# Patient Record
Sex: Male | Born: 1960 | Race: White | Hispanic: No | Marital: Married | Smoking: Never smoker
Health system: Southern US, Community
[De-identification: ages and names within clinical notes are randomized; demographics above are authoritative.]

## PROBLEM LIST (undated history)

## (undated) DIAGNOSIS — E119 Type 2 diabetes mellitus without complications: Secondary | ICD-10-CM

---

## 2009-02-24 DIAGNOSIS — Z86018 Personal history of other benign neoplasm: Secondary | ICD-10-CM

## 2009-02-24 HISTORY — DX: Personal history of other benign neoplasm: Z86.018

## 2009-05-21 ENCOUNTER — Emergency Department: Payer: Self-pay | Admitting: Emergency Medicine

## 2009-05-28 ENCOUNTER — Emergency Department: Payer: Self-pay | Admitting: Emergency Medicine

## 2011-05-04 ENCOUNTER — Ambulatory Visit: Payer: Self-pay | Admitting: Specialist

## 2012-08-31 IMAGING — CT CT STONE STUDY
1 of 2 series · 15 of 32 positions shown, 19 images · non-contrast
Comparison: none

REASON FOR EXAM: left flank pain hx of kidney stones
COMMENTS:

[Series 2: stone 3.0 i40f 3 · axial · 0.89mm/px · z∈[-508,-40]mm · 15 of 170 slices shown, 19 images]
[im 7/170  soft-tissue]
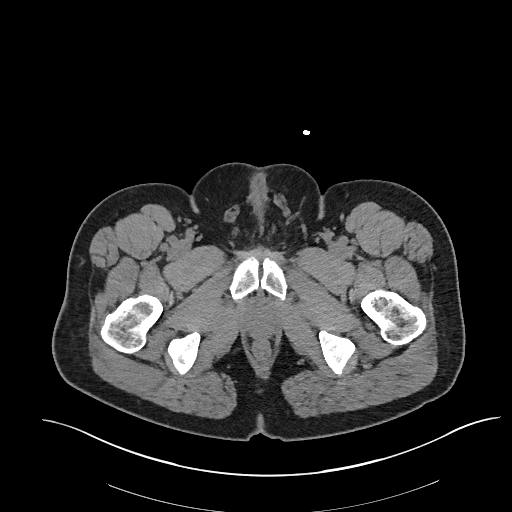
[im 7/170  bone]
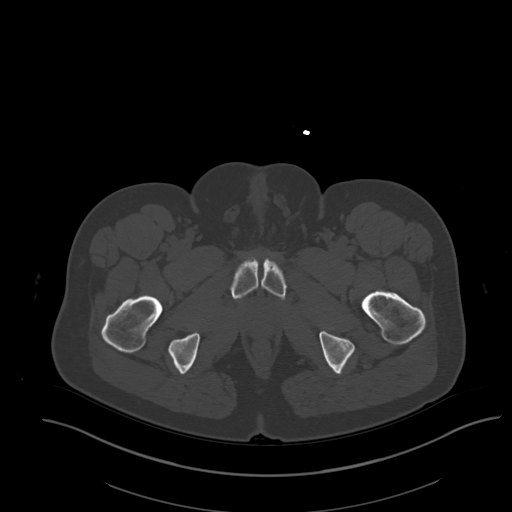
[im 20/170  soft-tissue]
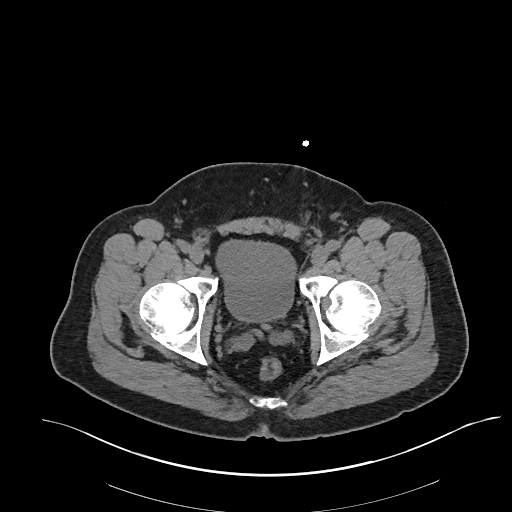
[im 33/170  soft-tissue]
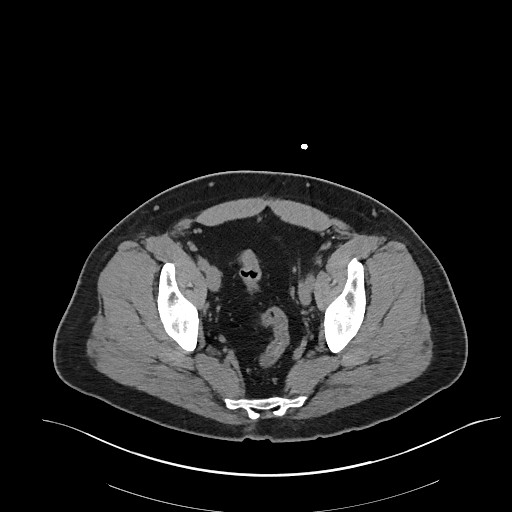
[im 46/170  soft-tissue]
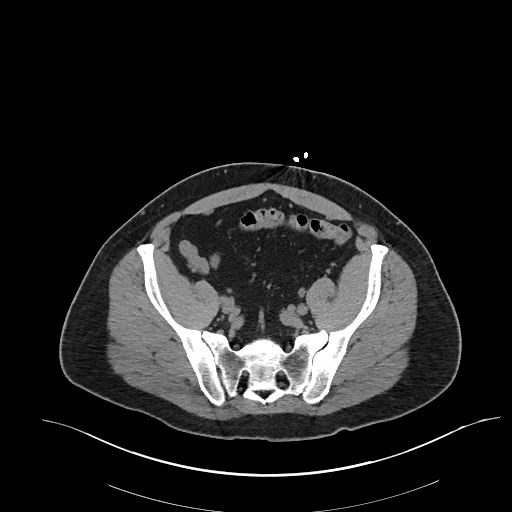
[im 59/170  soft-tissue]
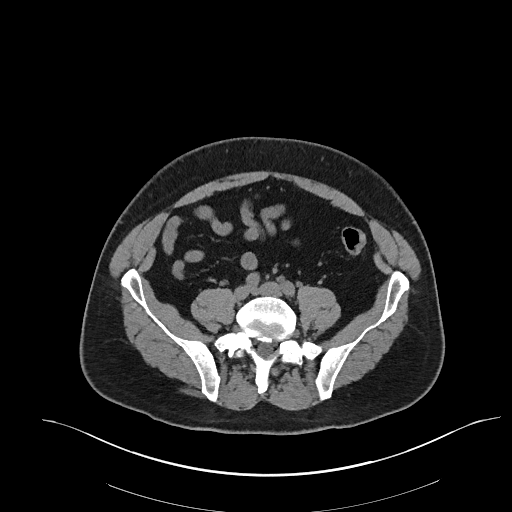
[im 72/170  soft-tissue]
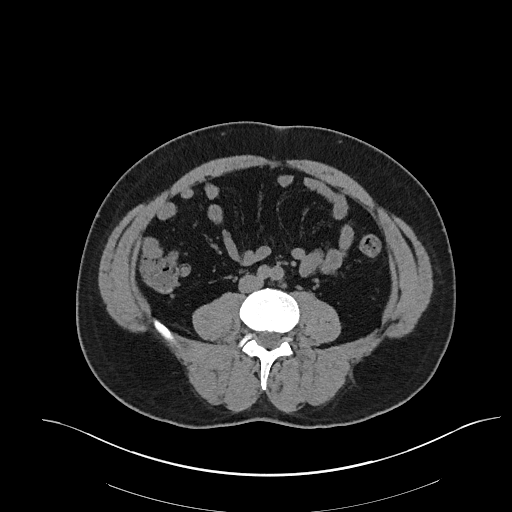
[im 85/170  soft-tissue]
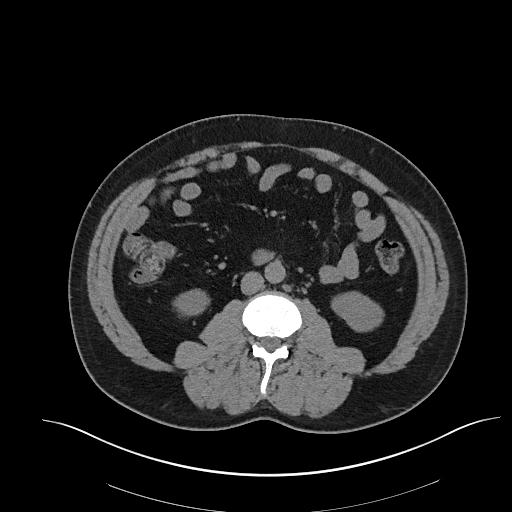
[im 98/170  soft-tissue]
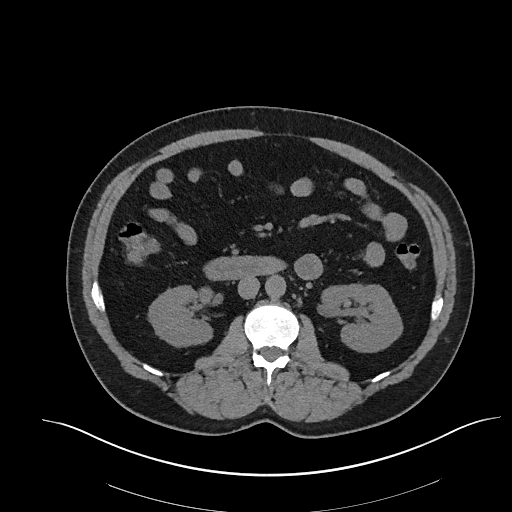
[im 111/170  soft-tissue]
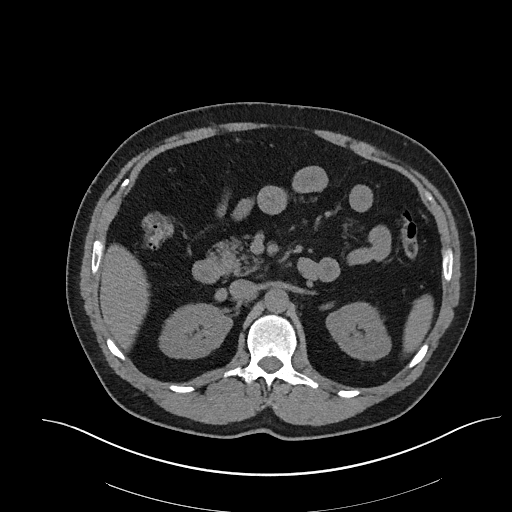
[im 111/170  bone]
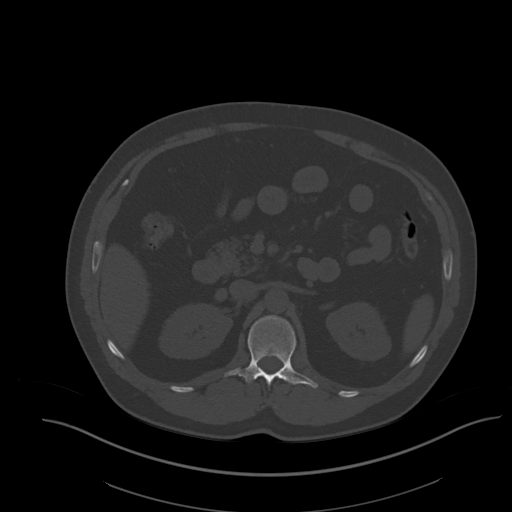
[im 124/170  soft-tissue]
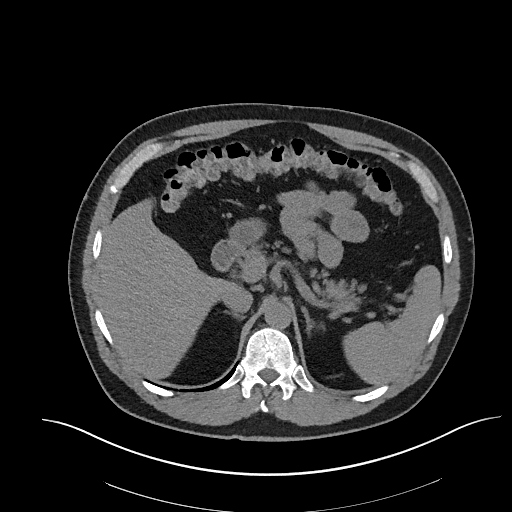
[im 137/170  soft-tissue]
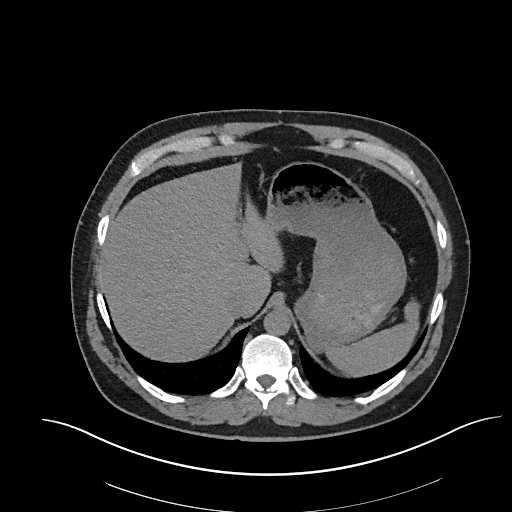
[im 144/170  lung]
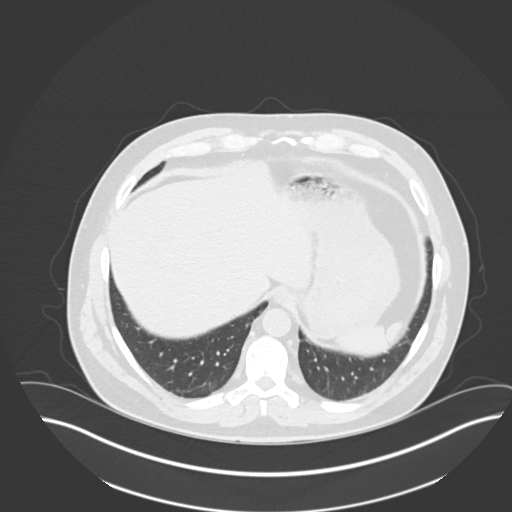
[im 150/170  soft-tissue]
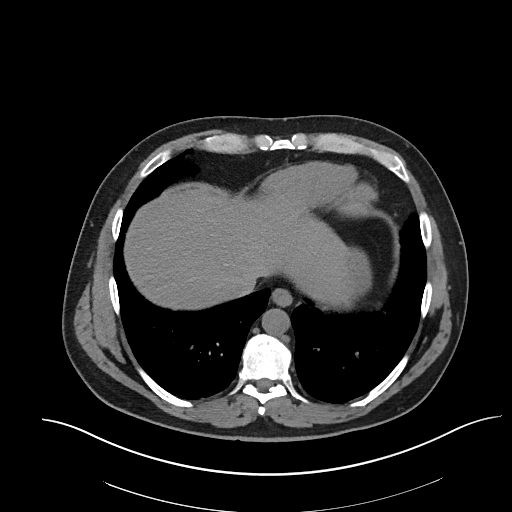
[im 150/170  lung]
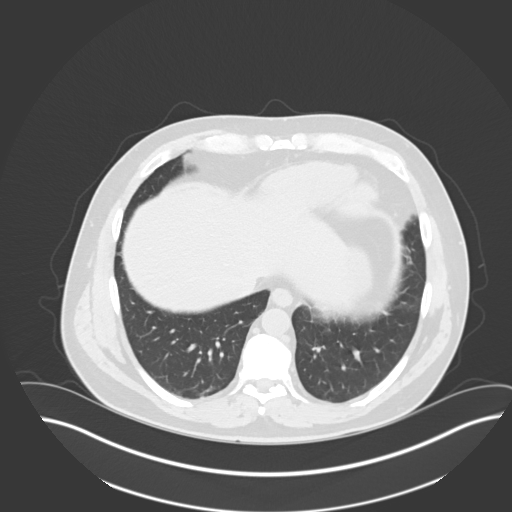
[im 157/170  lung]
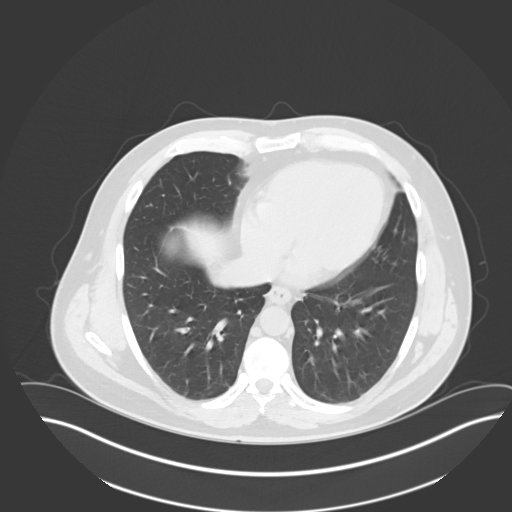
[im 163/170  soft-tissue]
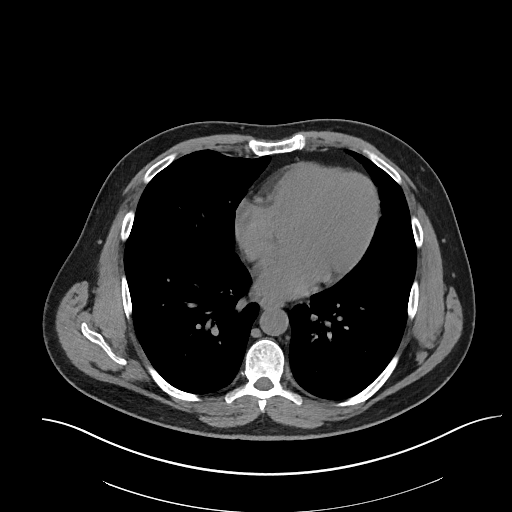
[im 163/170  lung]
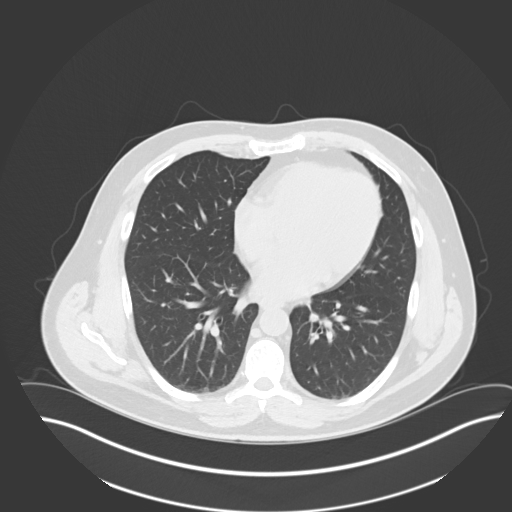

[15 of 32 positions shown; findings below may reference images not displayed]

PROCEDURE:     KCT - KCT ABDOMEN/PELVIS WO (STONE)  - May 04, 2011  [DATE]

RESULT:     Axial noncontrast CT scanning was performed through the abdomen
and pelvis at 3 mm intervals and slice thicknesses. Review of multiplanar
reconstructed images was performed separately on the VIA monitor.

There is very mild hydronephrosis and hydroureter on the left secondary to a
4 x 6 mm diameter stone in the distal left ureter on image 142. I see no
other stones on the left. In the right kidney there is a 2 mm diameter
nonobstructing lower pole stone and an adjacent submillimeter nonobstructing
stone as well. The partially distended urinary bladder is grossly normal.
The prostate gland is normal in size and contains calcifications. The
seminal vesicles are normal in appearance.

The liver, gallbladder, spleen, pancreas, moderately distended stomach, and
adrenal glands are normal in appearance. The caliber of the abdominal aorta
is normal. The unopacified loops of small and large bowel are normal in
appearance. I see no inguinal nor umbilical hernia. The lung bases are
clear. The lumbar vertebral bodies are preserved in height.
IMPRESSION: 1. There is a 4 x 6 mm diameter partially obstructing stone in the distal
third of the left ureter. There are no other stones demonstrated on the
left. There are tiny nonobstructing stones on the right.
2. I see no acute abnormality elsewhere within the abdomen or pelvis.

## 2013-08-21 ENCOUNTER — Ambulatory Visit: Payer: Self-pay | Admitting: Gastroenterology

## 2016-04-01 ENCOUNTER — Emergency Department: Payer: PRIVATE HEALTH INSURANCE

## 2016-04-01 ENCOUNTER — Other Ambulatory Visit: Payer: Self-pay

## 2016-04-01 ENCOUNTER — Emergency Department
Admission: EM | Admit: 2016-04-01 | Discharge: 2016-04-01 | Disposition: A | Discharge status: Home / Self Care | Payer: PRIVATE HEALTH INSURANCE | Attending: Emergency Medicine | Admitting: Emergency Medicine

## 2016-04-01 DIAGNOSIS — Y999 Unspecified external cause status: Secondary | ICD-10-CM | POA: Diagnosis not present

## 2016-04-01 DIAGNOSIS — W1800XA Striking against unspecified object with subsequent fall, initial encounter: Secondary | ICD-10-CM | POA: Insufficient documentation

## 2016-04-01 DIAGNOSIS — E119 Type 2 diabetes mellitus without complications: Secondary | ICD-10-CM | POA: Diagnosis not present

## 2016-04-01 DIAGNOSIS — Y929 Unspecified place or not applicable: Secondary | ICD-10-CM | POA: Insufficient documentation

## 2016-04-01 DIAGNOSIS — R55 Syncope and collapse: Secondary | ICD-10-CM | POA: Insufficient documentation

## 2016-04-01 DIAGNOSIS — S20211A Contusion of right front wall of thorax, initial encounter: Secondary | ICD-10-CM | POA: Diagnosis not present

## 2016-04-01 DIAGNOSIS — Y93C2 Activity, hand held interactive electronic device: Secondary | ICD-10-CM | POA: Insufficient documentation

## 2016-04-01 DIAGNOSIS — S299XXA Unspecified injury of thorax, initial encounter: Secondary | ICD-10-CM | POA: Diagnosis present

## 2016-04-01 HISTORY — DX: Type 2 diabetes mellitus without complications: E11.9

## 2016-04-01 LAB — CBC WITH DIFFERENTIAL/PLATELET
BASOS ABS: 0 10*3/uL (ref 0–0.1)
BASOS PCT: 1 %
Eosinophils Absolute: 0.1 10*3/uL (ref 0–0.7)
Eosinophils Relative: 1 %
HCT: 42.9 % (ref 40.0–52.0)
Hemoglobin: 14.9 g/dL (ref 13.0–18.0)
Lymphocytes Relative: 15 %
Lymphs Abs: 1.2 10*3/uL (ref 1.0–3.6)
MCH: 32.2 pg (ref 26.0–34.0)
MCHC: 34.7 g/dL (ref 32.0–36.0)
MCV: 92.9 fL (ref 80.0–100.0)
MONO ABS: 0.6 10*3/uL (ref 0.2–1.0)
Monocytes Relative: 7 %
Neutro Abs: 6.1 10*3/uL (ref 1.4–6.5)
Neutrophils Relative %: 76 %
Platelets: 156 10*3/uL (ref 150–440)
RBC: 4.62 MIL/uL (ref 4.40–5.90)
RDW: 13.2 % (ref 11.5–14.5)
WBC: 8 10*3/uL (ref 3.8–10.6)

## 2016-04-01 LAB — BASIC METABOLIC PANEL
Anion gap: 9 (ref 5–15)
BUN: 12 mg/dL (ref 6–20)
CO2: 23 mmol/L (ref 22–32)
Calcium: 9.6 mg/dL (ref 8.9–10.3)
Chloride: 108 mmol/L (ref 101–111)
Creatinine, Ser: 0.99 mg/dL (ref 0.61–1.24)
GFR calc Af Amer: >60 mL/min (ref >60)
GLUCOSE: 162 mg/dL — AB (ref 65–99)
Potassium: 3.4 mmol/L — ABNORMAL LOW (ref 3.5–5.1)
Sodium: 140 mmol/L (ref 135–145)

## 2016-04-01 LAB — TROPONIN I: Troponin I: <0.03 ng/mL (ref <0.031)
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
## 2016-04-07 ENCOUNTER — Emergency Department
Admission: EM | Admit: 2016-04-07 | Discharge: 2016-04-07 | Disposition: A | Discharge status: Home / Self Care | Payer: No Typology Code available for payment source | Attending: Emergency Medicine | Admitting: Emergency Medicine

## 2016-04-07 ENCOUNTER — Emergency Department: Payer: No Typology Code available for payment source

## 2016-04-07 ENCOUNTER — Encounter: Payer: Self-pay | Admitting: Emergency Medicine

## 2016-04-07 DIAGNOSIS — Y9389 Activity, other specified: Secondary | ICD-10-CM | POA: Diagnosis not present

## 2016-04-07 DIAGNOSIS — Y92410 Unspecified street and highway as the place of occurrence of the external cause: Secondary | ICD-10-CM | POA: Diagnosis not present

## 2016-04-07 DIAGNOSIS — S20212A Contusion of left front wall of thorax, initial encounter: Secondary | ICD-10-CM | POA: Diagnosis not present

## 2016-04-07 DIAGNOSIS — S233XXA Sprain of ligaments of thoracic spine, initial encounter: Secondary | ICD-10-CM | POA: Insufficient documentation

## 2016-04-07 DIAGNOSIS — S239XXA Sprain of unspecified parts of thorax, initial encounter: Secondary | ICD-10-CM

## 2016-04-07 DIAGNOSIS — M25511 Pain in right shoulder: Secondary | ICD-10-CM | POA: Diagnosis present

## 2016-04-07 DIAGNOSIS — E119 Type 2 diabetes mellitus without complications: Secondary | ICD-10-CM | POA: Insufficient documentation

## 2016-04-07 DIAGNOSIS — Y999 Unspecified external cause status: Secondary | ICD-10-CM | POA: Insufficient documentation

## 2016-04-07 MED ORDER — CYCLOBENZAPRINE HCL 10 MG PO TABS
10.0000 mg | ORAL_TABLET | Freq: Once | ORAL | Status: AC
  Administered 2016-04-07: 10 mg via ORAL
  Filled 2016-04-07: qty 1

## 2016-04-07 MED ORDER — CYCLOBENZAPRINE HCL 5 MG PO TABS: 5.0000 mg | ORAL_TABLET | Freq: Three times a day (TID) | ORAL | Status: AC | PRN
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
## 2016-04-08 MED ORDER — IBUPROFEN 800 MG PO TABS
ORAL_TABLET | ORAL | Status: AC
  Filled 2016-04-08: qty 1
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
## 2018-11-26 ENCOUNTER — Other Ambulatory Visit: Payer: Self-pay | Admitting: Family Medicine

## 2018-11-26 ENCOUNTER — Ambulatory Visit
Admission: RE | Admit: 2018-11-26 | Discharge: 2018-11-26 | Disposition: A | Discharge status: Home / Self Care | Payer: BC Managed Care – PPO | Source: Ambulatory Visit | Attending: Family Medicine | Admitting: Family Medicine

## 2018-11-26 DIAGNOSIS — M79662 Pain in left lower leg: Secondary | ICD-10-CM | POA: Diagnosis not present

## 2018-11-26 DIAGNOSIS — M79609 Pain in unspecified limb: Secondary | ICD-10-CM

## 2019-05-01 ENCOUNTER — Other Ambulatory Visit: Payer: Self-pay | Admitting: Endocrinology

## 2019-05-01 DIAGNOSIS — Z20822 Contact with and (suspected) exposure to covid-19: Secondary | ICD-10-CM

## 2019-05-04 LAB — NOVEL CORONAVIRUS, NAA: SARS-CoV-2, NAA: NOT DETECTED
